# Patient Record
Sex: Female | Born: 2000 | Race: White | Hispanic: No | Marital: Single | Smoking: Never smoker
Health system: Southern US, Community
[De-identification: ages and names within clinical notes are randomized; demographics above are authoritative.]

## PROBLEM LIST (undated history)

## (undated) DIAGNOSIS — G43109 Migraine with aura, not intractable, without status migrainosus: Secondary | ICD-10-CM

## (undated) HISTORY — DX: Migraine with aura, not intractable, without status migrainosus: G43.109

---

## 2021-07-17 ENCOUNTER — Emergency Department
Admission: EM | Admit: 2021-07-17 | Discharge: 2021-07-18 | Disposition: A | Discharge status: Home / Self Care | Payer: BLUE CROSS/BLUE SHIELD | Attending: Emergency Medicine | Admitting: Emergency Medicine

## 2021-07-17 ENCOUNTER — Other Ambulatory Visit: Payer: Self-pay

## 2021-07-17 DIAGNOSIS — R7401 Elevation of levels of liver transaminase levels: Secondary | ICD-10-CM | POA: Insufficient documentation

## 2021-07-17 DIAGNOSIS — Z20822 Contact with and (suspected) exposure to covid-19: Secondary | ICD-10-CM | POA: Insufficient documentation

## 2021-07-17 DIAGNOSIS — J029 Acute pharyngitis, unspecified: Secondary | ICD-10-CM | POA: Insufficient documentation

## 2021-07-17 DIAGNOSIS — B2790 Infectious mononucleosis, unspecified without complication: Secondary | ICD-10-CM | POA: Diagnosis not present

## 2021-07-17 DIAGNOSIS — D751 Secondary polycythemia: Secondary | ICD-10-CM | POA: Insufficient documentation

## 2021-07-17 DIAGNOSIS — R7981 Abnormal blood-gas level: Secondary | ICD-10-CM | POA: Diagnosis not present

## 2021-07-17 DIAGNOSIS — R7989 Other specified abnormal findings of blood chemistry: Secondary | ICD-10-CM | POA: Insufficient documentation

## 2021-07-17 DIAGNOSIS — R509 Fever, unspecified: Secondary | ICD-10-CM | POA: Diagnosis present

## 2021-07-17 DIAGNOSIS — R8289 Other abnormal findings on cytological and histological examination of urine: Secondary | ICD-10-CM | POA: Diagnosis not present

## 2021-07-17 LAB — CBC WITH DIFFERENTIAL/PLATELET
Abs Immature Granulocytes: 0.05 10*3/uL (ref 0.00–0.07)
Basophils Absolute: 0.1 10*3/uL (ref 0.0–0.1)
Basophils Relative: 0 %
Eosinophils Absolute: 0 10*3/uL (ref 0.0–0.5)
Eosinophils Relative: 0 %
HCT: 48.3 % — ABNORMAL HIGH (ref 36.0–46.0)
Hemoglobin: 15.4 g/dL — ABNORMAL HIGH (ref 12.0–15.0)
Immature Granulocytes: 0 %
Lymphocytes Relative: 68 %
Lymphs Abs: 7.7 10*3/uL — ABNORMAL HIGH (ref 0.7–4.0)
MCH: 27.3 pg (ref 26.0–34.0)
MCHC: 31.9 g/dL (ref 30.0–36.0)
MCV: 85.6 fL (ref 80.0–100.0)
Monocytes Absolute: 0.6 10*3/uL (ref 0.1–1.0)
Monocytes Relative: 5 %
Neutro Abs: 3.2 10*3/uL (ref 1.7–7.7)
Neutrophils Relative %: 27 %
Platelets: 255 10*3/uL (ref 150–400)
RBC: 5.64 MIL/uL — ABNORMAL HIGH (ref 3.87–5.11)
RDW: 14.3 % (ref 11.5–15.5)
Smear Review: NORMAL
WBC Morphology: ABNORMAL
WBC: 11.5 10*3/uL — ABNORMAL HIGH (ref 4.0–10.5)
nRBC: 0 % (ref 0.0–0.2)

## 2021-07-17 LAB — BASIC METABOLIC PANEL
Anion gap: 13 (ref 5–15)
BUN: 9 mg/dL (ref 6–20)
CO2: 21 mmol/L — ABNORMAL LOW (ref 22–32)
Calcium: 9.2 mg/dL (ref 8.9–10.3)
Chloride: 103 mmol/L (ref 98–111)
Creatinine, Ser: 0.47 mg/dL (ref 0.44–1.00)
GFR, Estimated: >60 mL/min (ref >60)
Glucose, Bld: 88 mg/dL (ref 70–99)
Potassium: 3.8 mmol/L (ref 3.5–5.1)
Sodium: 137 mmol/L (ref 135–145)

## 2021-07-17 LAB — URINALYSIS, ROUTINE W REFLEX MICROSCOPIC
Glucose, UA: NEGATIVE mg/dL
Ketones, ur: 20 mg/dL — AB
Leukocytes,Ua: NEGATIVE
Nitrite: NEGATIVE
Protein, ur: 100 mg/dL — AB
Specific Gravity, Urine: 1.038 — ABNORMAL HIGH (ref 1.005–1.030)
Squamous Epithelial / HPF: >50 — ABNORMAL HIGH (ref 0–5)
pH: 5 (ref 5.0–8.0)

## 2021-07-17 LAB — RESP PANEL BY RT-PCR (FLU A&B, COVID) ARPGX2
Influenza A by PCR: NEGATIVE
Influenza B by PCR: NEGATIVE
SARS Coronavirus 2 by RT PCR: NEGATIVE

## 2021-07-17 LAB — HCG, QUANTITATIVE, PREGNANCY: hCG, Beta Chain, Quant, S: <1 m[IU]/mL (ref <5)

## 2021-07-17 MED ORDER — ONDANSETRON 4 MG PO TBDP
4.0000 mg | ORAL_TABLET | Freq: Once | ORAL | Status: AC
Start: 2021-07-17 — End: 2021-07-17
  Administered 2021-07-17: 4 mg via ORAL
  Filled 2021-07-17: qty 1

## 2021-07-17 MED ORDER — KETOROLAC TROMETHAMINE 30 MG/ML IJ SOLN
15.0000 mg | Freq: Once | INTRAMUSCULAR | Status: AC
  Administered 2021-07-17: 15 mg via INTRAMUSCULAR
  Filled 2021-07-17: qty 1

## 2021-07-17 NOTE — ED Provider Notes (Signed)
? ?[facility] ?Provider Note ? ? ? Event Date/Time  ? First MD Initiated Contact with Patient 07/17/21 2251   ?  (approximate) ? ? ?History  ? ?Emesis ? ? ?HPI ? ?Sharon Mata is a 21 y.o. female  with no pmh who presents with emesis, fever body aches.  Symptoms have been going on for about 6 days.  Patient endorses diffuse body aches as well as intermittent fever as high as 100.  She has had multiple episodes of emesis about 2 to 3/day and decreased p.o. intake.  Denies diarrhea or abdominal pain.  She was at the beach over the weekend and had 2 syncopal episodes after standing up.  She has mild headache no visual change numbness weakness.  Denies tick bites or sick contacts.  Does have mild sore throat as well as some neck tenderness.  Today she developed urticaria on her bilateral upper extremities which has resolved.  Did see urgent care earlier in the week was told this was likely viral and had negative Monospot.  Denies urinary symptoms chest pain shortness of breath does have mild runny nose. ?  ? ?History reviewed. No pertinent past medical history. ? ?There are no problems to display for this patient. ? ? ? ?Physical Exam  ?Triage Vital Signs: ?ED Triage Vitals  ?Enc Vitals Group  ?   BP 07/17/21 1534 110/71  ?   Pulse Rate 07/17/21 1534 99  ?   Resp 07/17/21 1534 16  ?   Temp 07/17/21 1534 99.5 ?F (37.5 ?C)  ?   Temp Source 07/17/21 1534 Oral  ?   SpO2 07/17/21 1534 98 %  ?   Weight 07/17/21 1605 120 lb (54.4 kg)  ?   Height 07/17/21 1605 '5\' 5"'  (1.651 m)  ?   Head Circumference --   ?   Peak Flow --   ?   Pain Score 07/17/21 1613 0  ?   Pain Loc --   ?   Pain Edu? --   ?   Excl. in [place]? --   ? ? ?Most recent vital signs: ?Vitals:  ? 07/17/21 2309 07/18/21 0204  ?BP: 113/75 99/61  ?Pulse: 91 74  ?Resp: 16 16  ?Temp:    ?SpO2: 99% 99%  ? ? ? ?General: Awake, no distress.  ?CV:  Good peripheral perfusion.  ?Resp:  Normal effort.  Lungs are clear ?Abd:  No distention.  Abdomen is soft  nontender, no splenomegaly ?Neuro:             Awake, Alert, Oriented x 3  ?Other:  Left posterior cervical lymphadenopathy, mildly tender ?Bilateral tonsillar exudate ?No rashes ? ? ?ED Results / Procedures / Treatments  ?Labs ?(all labs ordered are listed, but only abnormal results are displayed) ?Labs Reviewed  ?CBC WITH DIFFERENTIAL/PLATELET - Abnormal; Notable for the following components:  ?    Result Value  ? WBC 11.5 (*)   ? RBC 5.64 (*)   ? Hemoglobin 15.4 (*)   ? HCT 48.3 (*)   ? Lymphs Abs 7.7 (*)   ? All other components within normal limits  ?BASIC METABOLIC PANEL - Abnormal; Notable for the following components:  ? CO2 21 (*)   ? All other components within normal limits  ?URINALYSIS, ROUTINE W REFLEX MICROSCOPIC - Abnormal; Notable for the following components:  ? Color, Urine AMBER (*)   ? APPearance CLOUDY (*)   ? Specific Gravity, Urine 1.038 (*)   ? Hgb urine  dipstick MODERATE (*)   ? Bilirubin Urine MODERATE (*)   ? Ketones, ur 20 (*)   ? Protein, ur 100 (*)   ? Bacteria, UA FEW (*)   ? Squamous Epithelial / LPF >50 (*)   ? All other components within normal limits  ?HEPATIC FUNCTION PANEL - Abnormal; Notable for the following components:  ? AST 165 (*)   ? ALT 231 (*)   ? Alkaline Phosphatase 271 (*)   ? Total Bilirubin 4.0 (*)   ? Bilirubin, Direct 2.3 (*)   ? Indirect Bilirubin 1.7 (*)   ? All other components within normal limits  ?ACETAMINOPHEN LEVEL - Abnormal; Notable for the following components:  ? Acetaminophen (Tylenol), Serum <10 (*)   ? All other components within normal limits  ?MONONUCLEOSIS SCREEN - Abnormal; Notable for the following components:  ? Mono Screen POSITIVE (*)   ? All other components within normal limits  ?RESP PANEL BY RT-PCR (FLU A&B, COVID) ARPGX2  ?GROUP A STREP BY PCR  ?HCG, QUANTITATIVE, PREGNANCY  ?PATHOLOGIST SMEAR REVIEW  ?HEPATITIS PANEL, ACUTE  ? ? ? ?EKG ? ? ? ? ?RADIOLOGY ? ? ? ?PROCEDURES: ? ?Critical Care performed: No ? ?Procedures ? ?The patient is  on the cardiac monitor to evaluate for evidence of arrhythmia and/or significant heart rate changes. ? ? ?MEDICATIONS ORDERED IN ED: ?Medications  ?ondansetron (ZOFRAN-ODT) disintegrating tablet 4 mg (4 mg Oral Given 07/17/21 2359)  ?ketorolac (TORADOL) 30 MG/ML injection 15 mg (15 mg Intramuscular Given 07/17/21 2359)  ?sodium chloride 0.9 % bolus 1,000 mL (1,000 mLs Intravenous New Bag/Given 07/18/21 0110)  ? ? ? ?IMPRESSION / MDM / ASSESSMENT AND PLAN / ED COURSE  ?I reviewed the triage vital signs and the nursing notes. ?             ?               ? ?Differential diagnosis includes, but is not limited to, viral illness, mono, strep, UTI, pneumonia, less likely tickborne illness ? ?Patient is a 21 year old female presents with several nonspecific symptoms for about 6 days including fever body aches headache nausea vomiting sore throat.  Vitals are reassuring.  On exam she looks well and nontoxic.  Her physical exam is notable for left posterior cervical lymphadenopathy tonsillar exudate but otherwise she has moist mucous membranes benign abdomen no rashes no meningismus.  Her labs show a increase in absolute lymphocytes and some polycythemia likely in the setting of dehydration.  Bicarb mildly low at 21.  Pregnancy test is negative.  UA with significant number of squames 21-50 WBCs but patient without urinary symptoms suspect that this is contaminated sample and does not represent UTI.  Overall I suspect mono versus viral illness.  We will send strep Monospot and give Zofran Toradol and see if she can tolerate p.o.  We will add on hepatic function panel as well.  If not tolerating p.o. will need IV fluids and IV antiemetics. ? ?Patient does not want to be stuck again for repeat Monospot, apparently this was negative on Monday.  I had ordered it because can be negative early on in illness but think it is reasonable to hold off.  Zofran patient tolerating p.o. feels improved. ? ?Patient's LFTs are elevated bili is 4  AST ALT mildly elevated as is alk phos. we will send hepatitis panel.  Patient agreeable to getting Monospot now as I think this would be important to know.  We will also get  a right upper quadrant ultrasound.  We will send Tylenol level. ? ?Patient's right upper quadrant ultrasound is normal.  Monospot notably positive which I think explains her whole constellation of symptoms.  Hepatitis panel still pending Tylenol level is negative.  Patient tolerating p.o. she did get a liter of fluid in the ED.  Overall she feels like she will be able to manage her symptoms and would like to be discharged.  I recommended that she return for repeat hepatic function panel in about 1 week.  We discussed return to the ED more emergently if she develops any yellowing of the skin or eyes blood in her stool pale stool or dark urine.  Also recommending returning if unable to tolerate p.o. ?FINAL CLINICAL IMPRESSION(S) / ED DIAGNOSES  ? ?Final diagnoses:  ?Elevated liver function tests  ?Infectious mononucleosis without complication, infectious mononucleosis due to unspecified organism  ? ? ? ?Rx / DC Orders  ? ?ED Discharge Orders   ? ?      Ordered  ?  ondansetron (ZOFRAN) 4 MG tablet  Daily PRN       ? 07/18/21 0229  ? ?  ?  ? ?  ? ? ? ?Note:  This document was prepared using Dragon voice recognition software and may include unintentional dictation errors. ?  ?Rada Hay, MD ?07/18/21 1959 ? ?

## 2021-07-17 NOTE — ED Triage Notes (Signed)
Pt states she has had fever chills and emesis x one week. Pt states she passed out last Friday. Pt states she continues to feel worse and not any better.  ?

## 2021-07-17 NOTE — ED Notes (Addendum)
After taking pts vitals, pt asked how much longer.  Explained that there are 8 people ahead of her.  Could not give her an exact time.  Pt and visitor gathered all belongings and walked out of ED lobby.  Unsure if she will be returning.  ?

## 2021-07-17 NOTE — ED Provider Triage Note (Signed)
?  Emergency Medicine Provider Triage Evaluation Note ? ?Sharon Mata , a 21 y.o.female,  was evaluated in triage.  Pt complains of fever, body aches, hives, and multiple episodes of vomiting for the past few days.  She states that she was recently at the beach and feels she may have caught something. ? ? ?Review of Systems  ?Positive: Fever, body aches, hives, vomiting ?Negative: Denies fever, chest pain, abdominal pain. ? ?Physical Exam  ? ?Vitals:  ? 07/17/21 1534  ?BP: 110/71  ?Pulse: 99  ?Resp: 16  ?Temp: 99.5 ?F (37.5 ?C)  ?SpO2: 98%  ? ?Gen:   Awake, no distress   ?Resp:  Normal effort  ?MSK:   Moves extremities without difficulty  ?Other:   ? ?Medical Decision Making  ?Given the patient's initial medical screening exam, the following diagnostic evaluation has been ordered. The patient will be placed in the appropriate treatment space, once one is available, to complete the evaluation and treatment. I have discussed the plan of care with the patient and I have advised the patient that an ED physician or mid-level practitioner will reevaluate their condition after the test results have been received, as the results may give them additional insight into the type of treatment they may need.  ? ? ?Diagnostics: Labs, urinalysis. ? ?Treatments: Ondansetron. ?  ?Varney Daily, [place] ?07/17/21 2318 ? ?

## 2021-07-18 ENCOUNTER — Emergency Department: Payer: BLUE CROSS/BLUE SHIELD

## 2021-07-18 LAB — HEPATIC FUNCTION PANEL
ALT: 231 U/L — ABNORMAL HIGH (ref 0–44)
AST: 165 U/L — ABNORMAL HIGH (ref 15–41)
Albumin: 4.2 g/dL (ref 3.5–5.0)
Alkaline Phosphatase: 271 U/L — ABNORMAL HIGH (ref 38–126)
Bilirubin, Direct: 2.3 mg/dL — ABNORMAL HIGH (ref 0.0–0.2)
Indirect Bilirubin: 1.7 mg/dL — ABNORMAL HIGH (ref 0.3–0.9)
Total Bilirubin: 4 mg/dL — ABNORMAL HIGH (ref 0.3–1.2)
Total Protein: 8 g/dL (ref 6.5–8.1)

## 2021-07-18 LAB — HEPATITIS PANEL, ACUTE
HCV Ab: NONREACTIVE
Hep A IgM: NONREACTIVE
Hep B C IgM: NONREACTIVE
Hepatitis B Surface Ag: NONREACTIVE

## 2021-07-18 LAB — ACETAMINOPHEN LEVEL: Acetaminophen (Tylenol), Serum: <10 ug/mL — ABNORMAL LOW (ref 10–30)

## 2021-07-18 LAB — MONONUCLEOSIS SCREEN: Mono Screen: POSITIVE — AB

## 2021-07-18 LAB — PATHOLOGIST SMEAR REVIEW

## 2021-07-18 LAB — GROUP A STREP BY PCR: Group A Strep by PCR: NOT DETECTED

## 2021-07-18 MED ORDER — ONDANSETRON HCL 4 MG PO TABS: 4.0000 mg | ORAL_TABLET | Freq: Every day | ORAL | 0 refills | Status: AC | PRN

## 2021-07-18 MED ORDER — SODIUM CHLORIDE 0.9 % IV BOLUS
1000.0000 mL | Freq: Once | INTRAVENOUS | Status: AC
  Administered 2021-07-18: 1000 mL via INTRAVENOUS

## 2021-07-18 NOTE — ED Notes (Signed)
Patient taken to Ultrasound department at this time. ?

## 2021-07-18 NOTE — Discharge Instructions (Addendum)
You have mononucleosis.  Your liver function tests were somewhat elevated which is likely from the mono.  Please follow-up for repeat blood work in 1 week.  You can either follow-up at [facility] clinic or return to the emergency department.  Please return to the ED sooner if you are developing yellow skin or yellow eyes pale stool or unable to drink. ? ?Please avoid taking Tylenol.  You can take Motrin for body aches.  You can take the Zofran for nausea and vomiting. ?

## 2021-07-18 NOTE — ED Notes (Signed)
Patient declined mononucleosis screen. Dr. Sidney Ace aware.  ?

## 2021-07-18 NOTE — ED Notes (Addendum)
Pt given a cup of water 

## 2021-11-19 ENCOUNTER — Encounter: Payer: Self-pay | Admitting: Adult Health

## 2021-11-19 ENCOUNTER — Ambulatory Visit (INDEPENDENT_AMBULATORY_CARE_PROVIDER_SITE_OTHER): Payer: BLUE CROSS/BLUE SHIELD | Admitting: Adult Health

## 2021-11-19 VITALS — BP 110/70 | HR 116 | Temp 99.0°F | Ht 64.5 in | Wt 128.0 lb

## 2021-11-19 DIAGNOSIS — R300 Dysuria: Secondary | ICD-10-CM | POA: Diagnosis not present

## 2021-11-19 DIAGNOSIS — N3090 Cystitis, unspecified without hematuria: Secondary | ICD-10-CM | POA: Diagnosis not present

## 2021-11-19 LAB — POCT URINALYSIS DIPSTICK (MANUAL)
Nitrite, UA: POSITIVE — AB
Poct Bilirubin: NEGATIVE
Poct Glucose: NORMAL mg/dL
Poct Ketones: NEGATIVE
Poct Urobilinogen: NORMAL mg/dL
Spec Grav, UA: 1.01 (ref 1.010–1.025)
pH, UA: 5 (ref 5.0–8.0)

## 2021-11-19 MED ORDER — NITROFURANTOIN MONOHYD MACRO 100 MG PO CAPS: 100.0000 mg | ORAL_CAPSULE | Freq: Two times a day (BID) | ORAL | 0 refills | Status: DC

## 2021-11-19 NOTE — Progress Notes (Signed)
[facility] [address] [place], [place] [address] Phone: [phone] Fax: [phone]   Office Visit Note  Patient Name: Sharon Mata  Date of Birth:[birth year]  Med Rec number [id number]  Date of Service: 11/19/2021  Patient has no known allergies.  Chief Complaint  Patient presents with   Urinary Tract Infection    UTI off and on for 3 weeks, BA and headache. Has drank cranberry juice and helped some.     Urinary Tract Infection  Associated symptoms include flank pain, frequency and urgency. Pertinent negatives include no hematuria, nausea or vomiting.    Patient reports she has been having some abdominal pain intermittently for the last 3 weeks.  She believes it is a UTI, and has been taking some otc meds like cranberry with no help.  She is currently sexually active with one female partner.  They are using condoms.  She has never been tested for STI's.  She is currently having dysuria, low back pain, and leg pain at times.  Urinary frequency and hesitancy.   Current Medication:  Outpatient Encounter Medications as of 11/19/2021  Medication Sig   nitrofurantoin, macrocrystal-monohydrate, (MACROBID) 100 MG capsule Take 1 capsule (100 mg total) by mouth 2 (two) times daily.   No facility-administered encounter medications on file as of 11/19/2021.    Medical History: No past medical history on file.   Vital Signs: BP 110/70 (BP Location: Left Arm, Patient Position: Sitting, Cuff Size: Normal)   Pulse (!) 116   Temp 99 F (37.2 C) (Tympanic)   Ht 5' 4.5" (1.638 m)   Wt 128 lb (58.1 kg)   SpO2 98%   BMI 21.63 kg/m    Review of Systems  Constitutional:  Positive for fatigue. Negative for diaphoresis and fever.  HENT:  Negative for sore throat.   Respiratory:  Negative for cough.   Gastrointestinal:  Negative for abdominal pain, diarrhea, nausea and vomiting.  Genitourinary:  Positive for dysuria, flank pain, frequency and urgency. Negative for hematuria.   Musculoskeletal:  Positive for myalgias.  Neurological:  Negative for headaches.    Physical Exam Vitals and nursing note reviewed.  Constitutional:      Appearance: Normal appearance.  Abdominal:     Tenderness: There is no abdominal tenderness. There is no right CVA tenderness or left CVA tenderness.  Neurological:     Mental Status: She is alert.    Assessment/Plan: 1. Cystitis Take Nitrofurantoin every 12 hours (twice a day) with food x 7d; Finish all antibiotics. Drink plenty of water. Avoid or limit alcohol and caffeine, which may make symptoms worse. You may take an over-the-counter pain reliever (i.e., AZO urinary pain relief, Tylenol) as needed for pain next 1-2 days. Send secure message to provider or schedule return appointment as needed for new/worsening symptoms (such as fever or abdominal pain) if your symptoms are not improving after 2-3 days taking antibiotics or if your symptoms do not completely resolve following antibiotics.  - nitrofurantoin, macrocrystal-monohydrate, (MACROBID) 100 MG capsule; Take 1 capsule (100 mg total) by mouth 2 (two) times daily.  Dispense: 14 capsule; Refill: 0  2. Dysuria - POCT Urinalysis Dip Manual     General Counseling: Cara verbalizes understanding of the findings of todays visit and agrees with plan of treatment. I have discussed any further diagnostic evaluation that may be needed or ordered today. We also reviewed her medications today. she has been encouraged to call the office with any questions or concerns that should arise related  to todays visit.   Orders Placed This Encounter  Procedures   POCT Urinalysis Dip Manual    Meds ordered this encounter  Medications   nitrofurantoin, macrocrystal-monohydrate, (MACROBID) 100 MG capsule    Sig: Take 1 capsule (100 mg total) by mouth 2 (two) times daily.    Dispense:  14 capsule    Refill:  0    Time spent:20 Minutes    Johnna Acosta AGNP-C Nurse Practitioner

## 2022-04-06 ENCOUNTER — Ambulatory Visit: Payer: BLUE CROSS/BLUE SHIELD | Admitting: Adult Health

## 2022-05-02 ENCOUNTER — Encounter: Payer: Self-pay | Admitting: Family

## 2022-05-02 ENCOUNTER — Ambulatory Visit (INDEPENDENT_AMBULATORY_CARE_PROVIDER_SITE_OTHER): Payer: BLUE CROSS/BLUE SHIELD | Admitting: Family

## 2022-05-02 ENCOUNTER — Other Ambulatory Visit: Payer: Self-pay

## 2022-05-02 VITALS — BP 102/68 | HR 108 | Temp 98.9°F | Ht 65.16 in | Wt 131.0 lb

## 2022-05-02 DIAGNOSIS — R52 Pain, unspecified: Secondary | ICD-10-CM

## 2022-05-02 DIAGNOSIS — J111 Influenza due to unidentified influenza virus with other respiratory manifestations: Secondary | ICD-10-CM

## 2022-05-02 LAB — POC SOFIA 2 FLU + SARS ANTIGEN FIA
Influenza A, POC: NEGATIVE
Influenza B, POC: NEGATIVE
SARS Coronavirus 2 Ag: NEGATIVE

## 2022-05-02 NOTE — Progress Notes (Signed)
[place] [place] [place], [place] [address] Phone: [phone] Fax: [phone]   Office Visit Note  Patient Name: Sharon Mata  Date of [id number]  Med Rec number [id number]  Date of Service: 05/02/2022  Patient has no known allergies.  Chief Complaint  Patient presents with   Generalized Body Aches   Cough   Sore Throat   Migraine    24 hrs   Nasal Congestion   Fatigue     Cough Associated symptoms include chills, a fever and a sore throat. Pertinent negatives include no shortness of breath.  Sore Throat  Associated symptoms include congestion and coughing. Pertinent negatives include no shortness of breath.  Migraine  Associated symptoms include coughing, a fever and a sore throat.   Pt presents with cold/flu symptoms that started 2 days ago.  Otc Dayquil, Advil and Tylenol.  Headache 8 of 10.      Current Medication:  Outpatient Encounter Medications as of 05/02/2022  Medication Sig   [DISCONTINUED] nitrofurantoin, macrocrystal-monohydrate, (MACROBID) 100 MG capsule Take 1 capsule (100 mg total) by mouth 2 (two) times daily.   No facility-administered encounter medications on file as of 05/02/2022.      Medical History: No past medical history on file.   Vital Signs: Vitals:   05/02/22 1049  BP: 102/68  Pulse: (!) 108  Temp: 98.9 F (37.2 C)  SpO2: 98%      Review of Systems  Constitutional:  Positive for activity change, chills, fatigue and fever.  HENT:  Positive for congestion and sore throat.   Respiratory:  Positive for cough. Negative for shortness of breath.   Gastrointestinal: Negative.   Skin: Negative.     Physical Exam Constitutional:      Appearance: Normal appearance. She is well-developed.  HENT:     Right Ear: Tympanic membrane normal.     Left Ear: Tympanic membrane normal.     Nose: Congestion present.     Mouth/Throat:     Mouth: Mucous membranes are moist.     Pharynx: Oropharynx is clear.   Cardiovascular:     Rate and Rhythm: Normal rate and regular rhythm.  Pulmonary:     Effort: Pulmonary effort is normal.     Breath sounds: Normal breath sounds.  Musculoskeletal:     Cervical back: Normal range of motion and neck supple.  Skin:    General: Skin is warm.  Neurological:     General: No focal deficit present.     Mental Status: She is alert and oriented to person, place, and time.       Assessment/Plan: 1. Body aches  - POC SOFIA 2 FLU + SARS ANTIGEN FIA Results for orders placed or performed in visit on 05/02/22 (from the past 24 hour(s))  POC SOFIA 2 FLU + SARS ANTIGEN FIA     Status: Normal   Collection Time: 05/02/22 11:08 AM  Result Value Ref Range   Influenza A, POC Negative Negative   Influenza B, POC Negative Negative   SARS Coronavirus 2 Ag Negative Negative    2. Influenza-like illness Reviewed viral findings.  Discussed increase regimen with otc Ibuprofen and Tylenol.  887m Ibuprofen alternating with 10085mof Tylenol every 4 hours.  Pt will call tomorrow if no improvement with migraine, will consider Toradol. Discussed rest and hydration.       General Counseling: Avmakiaya oflahertynderstanding of the findings of todays visit and agrees with plan of treatment. I have discussed any further  diagnostic evaluation that may be needed or ordered today. We also reviewed her medications today. she has been encouraged to call the office with any questions or concerns that should arise related to todays visit.   Orders Placed This Encounter  Procedures   POC SOFIA 2 FLU + SARS ANTIGEN FIA    No orders of the defined types were placed in this encounter.   Time spent:15  Minutes Time spent includes review of chart, medications, test results, and follow up plan with the patient.    Wardell Honour, FNP-C Nurse Practitioner

## 2022-05-28 ENCOUNTER — Encounter: Payer: Self-pay | Admitting: Family Medicine

## 2022-05-28 ENCOUNTER — Ambulatory Visit (INDEPENDENT_AMBULATORY_CARE_PROVIDER_SITE_OTHER): Payer: BLUE CROSS/BLUE SHIELD | Admitting: Family Medicine

## 2022-05-28 VITALS — BP 112/68 | HR 107 | Temp 99.5°F | Wt 136.0 lb

## 2022-05-28 DIAGNOSIS — R350 Frequency of micturition: Secondary | ICD-10-CM

## 2022-05-28 DIAGNOSIS — R300 Dysuria: Secondary | ICD-10-CM

## 2022-05-28 LAB — POCT URINALYSIS DIPSTICK (MANUAL)
Leukocytes, UA: NEGATIVE
Nitrite, UA: NEGATIVE
Poct Bilirubin: NEGATIVE
Poct Blood: 50 — AB
Poct Glucose: NORMAL mg/dL
Poct Ketones: NEGATIVE
Poct Urobilinogen: NORMAL mg/dL
pH, UA: 6 (ref 5.0–8.0)

## 2022-05-28 MED ORDER — NITROFURANTOIN MONOHYD MACRO 100 MG PO CAPS: 100.0000 mg | ORAL_CAPSULE | Freq: Two times a day (BID) | ORAL | 0 refills | Status: DC

## 2022-05-28 NOTE — Progress Notes (Signed)
[place] [place], [place] [address] Phone: [phone] Fax: [phone]   Office Visit Note  Patient Name: Sharon Mata  Date of [id number]  Med Rec number [id number]  Date of Service: 05/28/2022  Patient has no known allergies.  Chief Complaint  Patient presents with   Urinary Tract Infection     Burning with urination, frequnet urinatin, getting worse Started 2 week ago - has taken cranberry juice and stayed hydrated  Slight back pain , no fever  Urinary Tract Infection  Associated symptoms include frequency and urgency.      Current Medication:  Outpatient Encounter Medications as of 05/28/2022  Medication Sig   acetaminophen (TYLENOL) 325 MG tablet Take 650 mg by mouth every 6 (six) hours as needed. (Patient not taking: Reported on 05/28/2022)   No facility-administered encounter medications on file as of 05/28/2022.      Medical History: No past medical history on file.   Vital Signs: BP 112/68   Pulse (!) 107   Temp 99.5 F (37.5 C) (Tympanic)   Wt 136 lb (61.7 kg)   SpO2 98%   BMI 22.52 kg/m    Review of Systems  Constitutional:  Negative for fever.  Genitourinary:  Positive for decreased urine volume, dysuria, frequency and urgency.    Physical Exam Vitals reviewed.  Constitutional:      Appearance: Normal appearance.  Abdominal:     Tenderness: There is no abdominal tenderness. There is no right CVA tenderness or left CVA tenderness.  Neurological:     Mental Status: She is alert.      Assessment/Plan:  1. Frequent urination - POCT Urinalysis Dip Manual  Results for orders placed or performed in visit on 05/28/22 (from the past 24 hour(s))  POCT Urinalysis Dip Manual     Status: Abnormal   Collection Time: 05/28/22  3:49 PM  Result Value Ref Range   Spec Grav, UA     pH, UA 6.0 5.0 - 8.0   Leukocytes, UA Negative Negative   Nitrite, UA Negative Negative   Poct Protein +++500 (A) Negative,  trace mg/dL   Poct Glucose Normal Normal mg/dL   Poct Ketones Negative Negative   Poct Urobilinogen Normal Normal mg/dL   Poct Bilirubin Negative Negative   Poct Blood =50 (A) Negative, trace     2. Dysuria Take macrobid two times daily for 7 days Start 4 ounces of cranberry juice at bedtime to try to prveent further infections      General Counseling: Marene Lenz understanding of the findings of todays visit and agrees with plan of treatment. I have discussed any further diagnostic evaluation that may be needed or ordered today. We also reviewed her medications today. she has been encouraged to call the office with any questions or concerns that should arise related to todays visit.   No orders of the defined types were placed in this encounter.   No orders of the defined types were placed in this encounter.    Dr Evette Doffing Sunny Aguon ABFM University Physician

## 2022-07-06 ENCOUNTER — Ambulatory Visit (INDEPENDENT_AMBULATORY_CARE_PROVIDER_SITE_OTHER): Payer: BLUE CROSS/BLUE SHIELD | Admitting: Adult Health

## 2022-07-06 ENCOUNTER — Encounter: Payer: Self-pay | Admitting: Adult Health

## 2022-07-06 VITALS — BP 114/62 | HR 105 | Temp 99.0°F

## 2022-07-06 DIAGNOSIS — R0982 Postnasal drip: Secondary | ICD-10-CM | POA: Diagnosis not present

## 2022-07-06 DIAGNOSIS — J301 Allergic rhinitis due to pollen: Secondary | ICD-10-CM | POA: Diagnosis not present

## 2022-07-06 NOTE — Progress Notes (Signed)
[facility] [address] [place], [place] [address] Phone: [phone] Fax: [phone]   Office Visit Note  Patient Name: Sharon Mata  Date of Birth:12-29-2000  Med Rec number [id number]  Date of Service: 07/06/2022  Patient has no known allergies.  Chief Complaint  Patient presents with   Acute Visit     HPI  Patient reports she has been sick for the past week.  She describes throat pain that has been keeping her awake at night. She also has congestion, ear pain, mild PND. She has been using the Netty pot with some improvement.  She had fever initially, but that quickly resolved. Advil helps at times.  It worse at night, and she feels pretty good during the day.  She is also taking an allergy medication daily as well.   Current Medication:  Outpatient Encounter Medications as of 07/06/2022  Medication Sig   acetaminophen (TYLENOL) 325 MG tablet Take 650 mg by mouth every 6 (six) hours as needed. (Patient not taking: Reported on 05/28/2022)   nitrofurantoin, macrocrystal-monohydrate, (MACROBID) 100 MG capsule Take 1 capsule (100 mg total) by mouth 2 (two) times daily. (Patient not taking: Reported on 07/06/2022)   No facility-administered encounter medications on file as of 07/06/2022.      Medical History: No past medical history on file.   Vital Signs: BP 114/62   Pulse (!) 105   Temp 99 F (37.2 C) (Tympanic)   SpO2 100%    Review of Systems  Constitutional:  Positive for fatigue. Negative for chills and fever.  HENT:  Positive for congestion, ear pain, postnasal drip, sinus pressure and sore throat.   Eyes:  Negative for pain and itching.  Respiratory:  Negative for cough.   Cardiovascular:  Negative for chest pain.  Gastrointestinal:  Negative for diarrhea, nausea and vomiting.    Physical Exam Vitals and nursing note reviewed.  Constitutional:      Appearance: Normal appearance.  HENT:     Head: Normocephalic.     Left Ear: Tympanic  membrane and ear canal normal.     Ears:     Comments: Right TM appears to have fluid, and some mild bulging    Nose: Nose normal.     Right Turbinates: Pale.     Left Turbinates: Pale.     Mouth/Throat:     Mouth: Mucous membranes are moist.  Eyes:     Pupils: Pupils are equal, round, and reactive to light.  Pulmonary:     Effort: Pulmonary effort is normal.     Breath sounds: Normal breath sounds.  Lymphadenopathy:     Cervical: No cervical adenopathy.  Neurological:     Mental Status: She is alert.    Assessment/Plan: 1. Seasonal allergic rhinitis due to pollen Discussed using Daily allergy medication such as Zyrtec or Claritin. Also instructed patient to use Flonase, Two sprays in each nostril twice daily. Follow up via MyChart messenger if symptoms fail to improve or may return to clinic as needed for worsening symptoms.    2. PND (post-nasal drip) Continue OTC meds as before.  Add Flonase as discussed.      General Counseling: adaline trejos understanding of the findings of todays visit and agrees with plan of treatment. I have discussed any further diagnostic evaluation that may be needed or ordered today. We also reviewed her medications today. she has been encouraged to call the office with any questions or concerns that should arise related to todays visit.  No orders of the defined types were placed in this encounter.   No orders of the defined types were placed in this encounter.   Time spent:15 Minutes Time spent includes review of chart, medications, test results, and follow up plan with the patient.    Johnna Acosta AGNP-C Nurse Practitioner

## 2022-07-08 ENCOUNTER — Encounter: Payer: Self-pay | Admitting: Adult Health

## 2023-12-17 ENCOUNTER — Encounter: Payer: Self-pay | Admitting: Medical

## 2023-12-17 ENCOUNTER — Ambulatory Visit (INDEPENDENT_AMBULATORY_CARE_PROVIDER_SITE_OTHER): Admitting: Medical

## 2023-12-17 ENCOUNTER — Other Ambulatory Visit: Payer: Self-pay

## 2023-12-17 VITALS — BP 112/60 | HR 88 | Temp 96.7°F | Wt 124.0 lb

## 2023-12-17 DIAGNOSIS — R300 Dysuria: Secondary | ICD-10-CM | POA: Diagnosis not present

## 2023-12-17 DIAGNOSIS — Z113 Encounter for screening for infections with a predominantly sexual mode of transmission: Secondary | ICD-10-CM

## 2023-12-17 DIAGNOSIS — Z3009 Encounter for other general counseling and advice on contraception: Secondary | ICD-10-CM

## 2023-12-17 LAB — POCT URINALYSIS DIPSTICK (MANUAL)
Leukocytes, UA: NEGATIVE
Nitrite, UA: NEGATIVE
Poct Bilirubin: NEGATIVE
Poct Glucose: NORMAL mg/dL
Poct Ketones: NEGATIVE
Poct Urobilinogen: NORMAL mg/dL
Spec Grav, UA: 1.02
pH, UA: 6

## 2023-12-17 NOTE — Patient Instructions (Addendum)
-  Drink plenty of water. Limit alcohol and caffeine. -You will receive a MyChart message notifying you of your lab results when they are available.  -Send message to provider in meantime as needed for new/worsening symptoms (such as fever, abdominal pain, frequent urination or increased pain with urination).  -Check out the [facility] website or [email or web address] for more information on birth control options. -Send me a message or schedule a follow up visit if you wish to discuss birth control in more detail. -Use condoms every time you have sex to help prevent sexually transmitted infections. -If you have unprotected sex, you may use Plan B (emergency contraception) within 72 hours. -Also, consider scheduling an appointment with us  or with your primary care/gynecologist for a pap smear/cervical cancer screening test. It is recommended to begin having this screening at age 45.

## 2023-12-17 NOTE — Progress Notes (Signed)
 [facility] [address] [place], [place] [address] Phone: [phone] Fax: [phone]   Office Visit Note  Patient Name: Sharon Mata  Date of Birth:2001-03-04  Med Rec number [id number]  Date of Service: 12/17/2023  Allergies: Patient has no known allergies.  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  Has had slight burning with urination last few days, not getting worse. No frequency or urgency. No abdominal pain, back pain, nausea/vomiting, fever or chills. No vaginal sx. No hematuria, urine order or cloudiness.  Sexually active with males, new partner as of few weeks ago. Using condoms most of time. Not on any birth control, has not taken in past.   LMP 12/04/23   Current Medication:  Outpatient Encounter Medications as of 12/17/2023  Medication Sig   escitalopram (LEXAPRO) 10 MG tablet Take 10 mg by mouth daily.   escitalopram (LEXAPRO) 5 MG tablet Take 5 mg by mouth daily.   spironolactone (ALDACTONE) 50 MG tablet    [DISCONTINUED] acetaminophen  (TYLENOL ) 325 MG tablet Take 650 mg by mouth every 6 (six) hours as needed. (Patient not taking: Reported on 12/17/2023)   [DISCONTINUED] nitrofurantoin , macrocrystal-monohydrate, (MACROBID ) 100 MG capsule Take 1 capsule (100 mg total) by mouth 2 (two) times daily. (Patient not taking: Reported on 12/17/2023)   No facility-administered encounter medications on file as of 12/17/2023.      Medical History: Past Medical History:  Diagnosis Date   Migraine with aura      Vital Signs: BP 112/60   Pulse 88   Temp (!) 96.7 F (35.9 C) (Tympanic)   Wt 124 lb (56.2 kg)   SpO2 98%   BMI 20.54 kg/m    Review of Systems  Constitutional:  Negative for chills and fever.  Genitourinary:  Positive for dysuria. Negative for flank pain, frequency, genital sores, hematuria, pelvic pain, urgency, vaginal discharge and vaginal pain.     Physical Exam Vitals reviewed.  Constitutional:      General: She is not in acute distress.    Appearance: She is not ill-appearing.  Neurological:     Mental Status: She is alert.    Results for orders placed or performed in visit on 12/17/23 (from the past 24 hours)  POCT Urinalysis Dip Manual     Status: None   Collection Time: 12/17/23  1:43 PM  Result Value Ref Range   Spec Grav, UA 1.020 1.010 - 1.025   pH, UA 6.0 5.0 - 8.0   Leukocytes, UA Negative Negative   Nitrite, UA Negative Negative   Poct Protein trace Negative, trace mg/dL   Poct Glucose Normal Normal mg/dL   Poct Ketones Negative Negative   Poct Urobilinogen Normal Normal mg/dL   Poct Bilirubin Negative Negative   Poct Blood trace Negative, trace     Assessment/Plan: 1. Dysuria (Primary) Minimal findings on urinalysis. Will send urine for culture. Encouraged to hydrate. Will follow up with results when available.   - POCT Urinalysis Dip Manual - Urine Culture  2. Screening examination for STD (sexually transmitted disease) Will send vaginal swab (self-collected) for STI screening. Encouraged consistent use of condoms.  - Chlamydia/Gonococcus/Trichomonas, NAA  3. Encounter for counseling regarding contraception Encouraged to consider starting another form of contraception given that condoms can be unreliable, especially when not used consistently. Discussed [facility] and [facility] as good options for information on different forms of birth control including possible  side effects. Encouraged patient to send message or schedule follow up visit with any questions or if she wishes to discuss options in more detail.    Patient Instructions  -Drink plenty of water. Limit alcohol and caffeine. -You will receive a MyChart message notifying you of your lab results when they are available.  -Send message to provider in meantime as needed for new/worsening symptoms (such as fever, abdominal pain,  frequent urination or increased pain with urination).  -Check out the [facility] website or [facility] for more information on birth control options. -Send me a message or schedule a follow up visit if you wish to discuss birth control in more detail. -Use condoms every time you have sex to help prevent sexually transmitted infections. -If you have unprotected sex, you may use Plan B (emergency contraception) within 72 hours. -Also, consider scheduling an appointment with us  or with your primary care/gynecologist for a pap smear/cervical cancer screening test. It is recommended to begin having this screening at age 52.     General Counseling: Deniya has been encouraged to call the office with any questions or concerns that should arise related to todays visit.    Time spent:20 Minutes    Joen Arts PA-C [facility] 12/17/2023 1:41 PM

## 2023-12-20 ENCOUNTER — Ambulatory Visit: Payer: Self-pay | Admitting: Medical

## 2023-12-20 LAB — URINE CULTURE

## 2023-12-21 LAB — CHLAMYDIA/GONOCOCCUS/TRICHOMONAS, NAA
Chlamydia by NAA: NEGATIVE
Gonococcus by NAA: NEGATIVE
Trich vag by NAA: NEGATIVE

## 2024-02-26 IMAGING — US US ABDOMEN LIMITED
1 series · 14 of 25 positions shown · non-contrast
Comparison: None Available.

CLINICAL DATA: Right upper quadrant abdominal pain.

EXAM:
ULTRASOUND ABDOMEN LIMITED RIGHT UPPER QUADRANT

[Series 1: us abdomen limited ruq (liver/gb) · 14 of 35 slices shown]
[im 1/35]
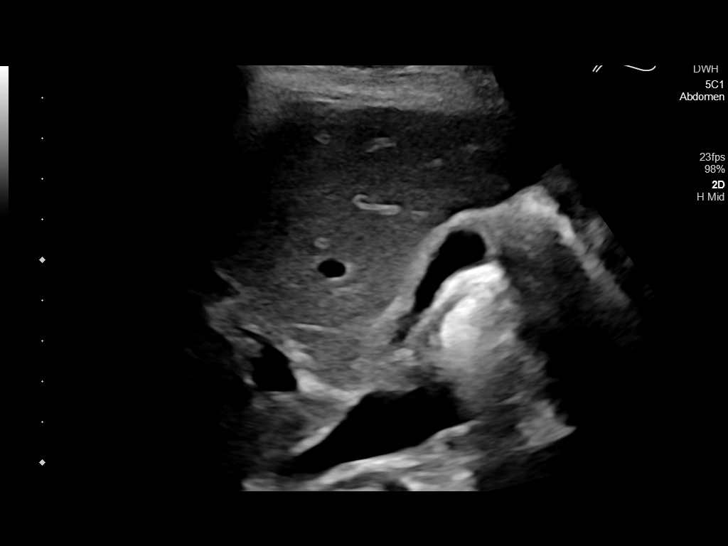
[im 3/35]
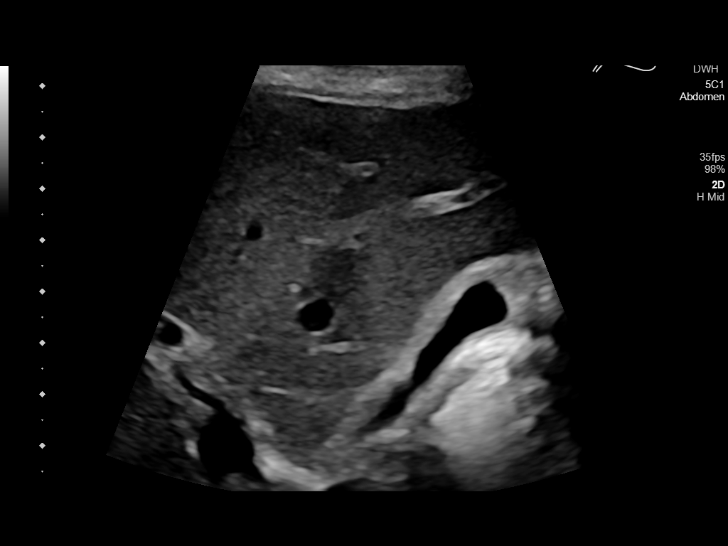
[im 6/35]
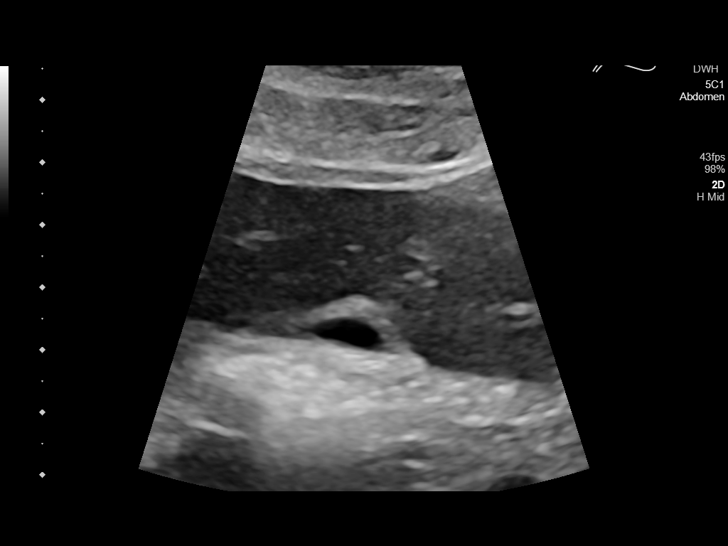
[im 9/35]
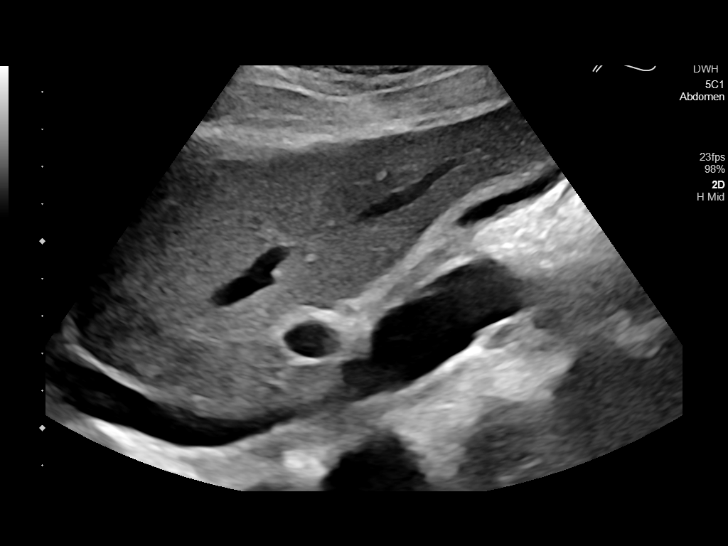
[im 12/35]
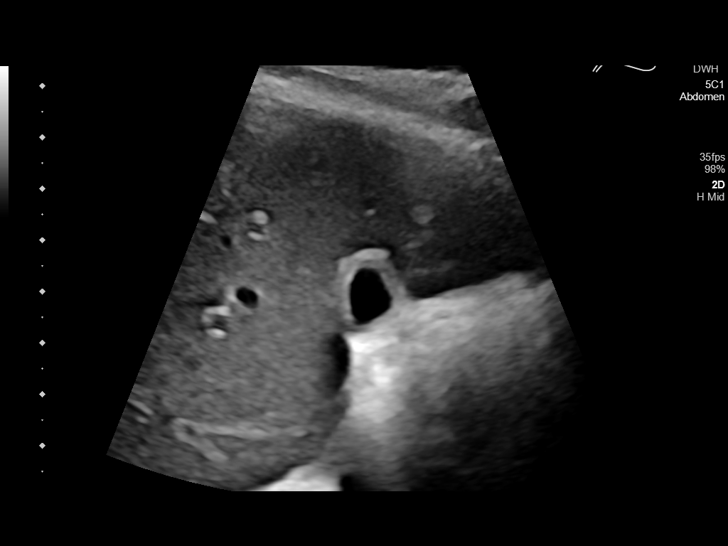
[im 13/35]
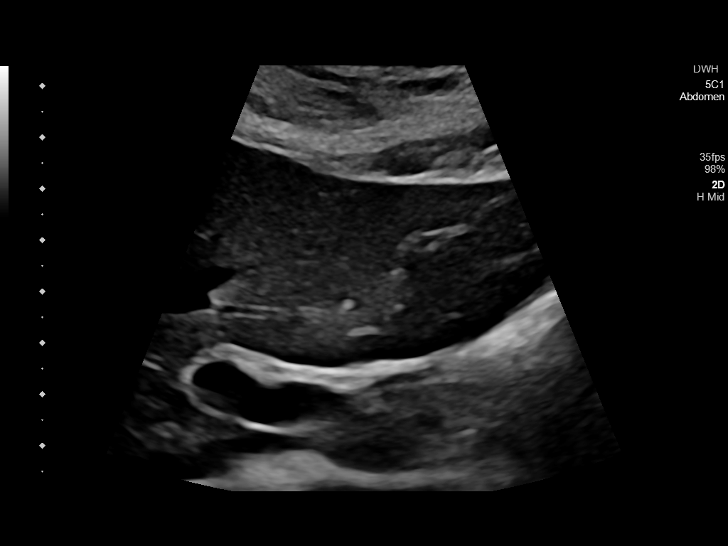
[im 16/35]
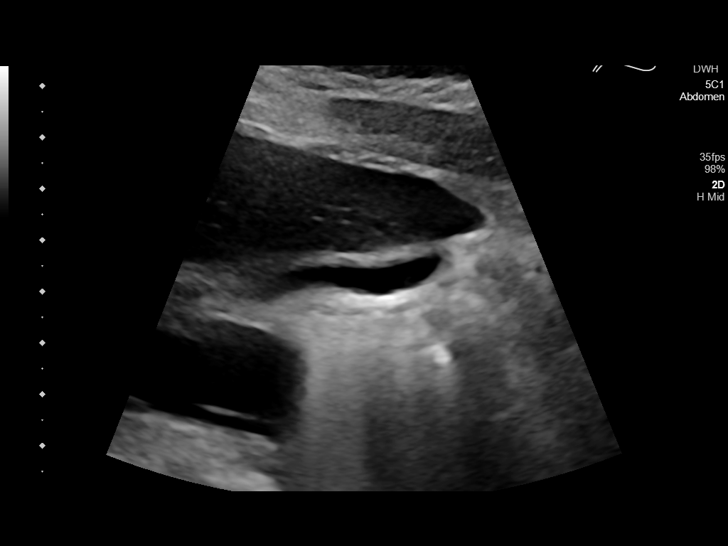
[im 19/35]
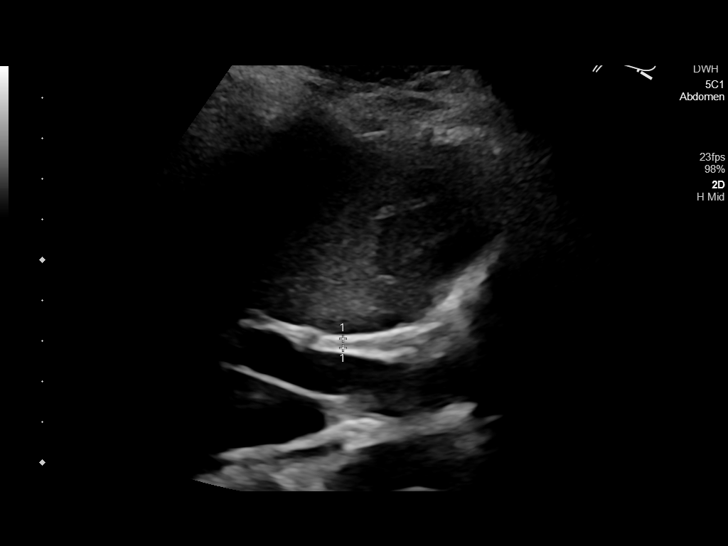
[im 22/35]
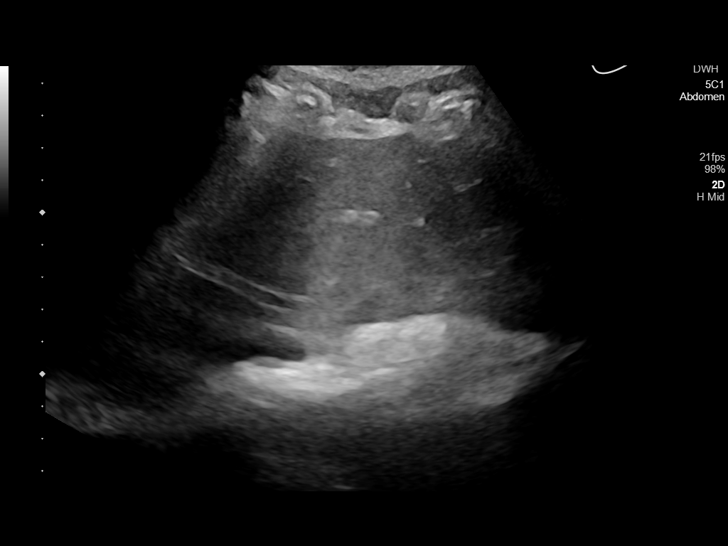
[im 23/35]
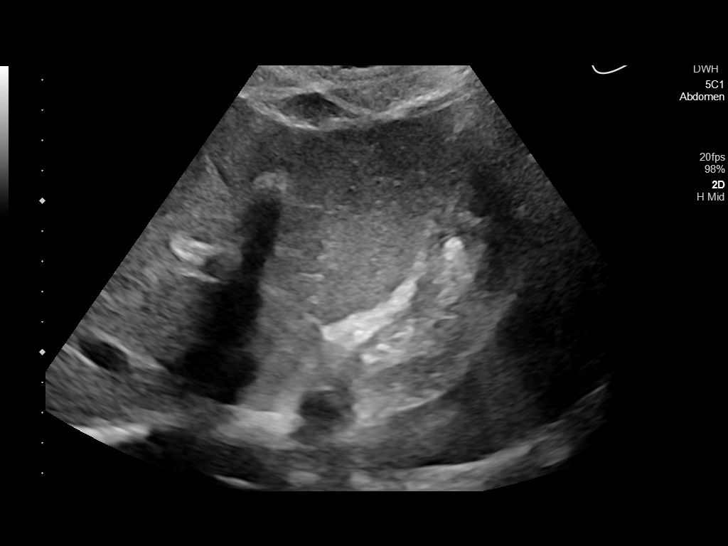
[im 26/35]
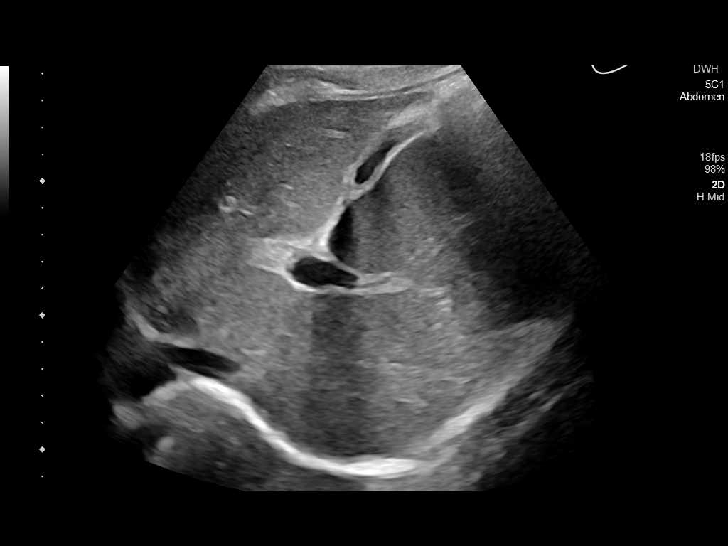
[im 29/35]
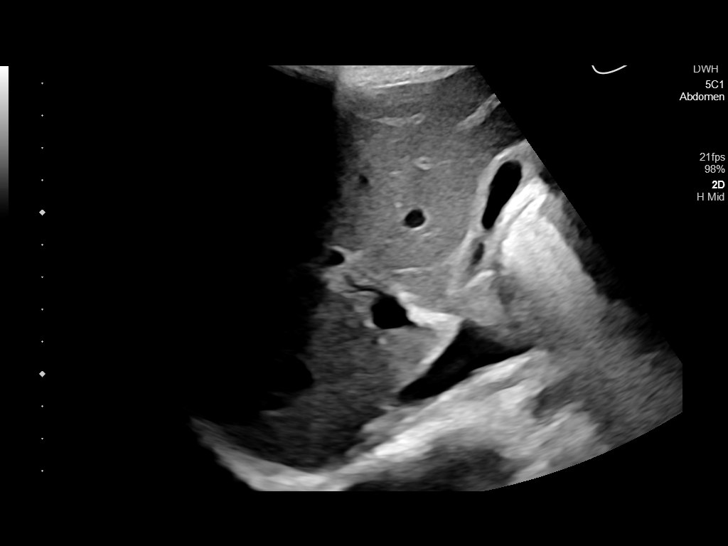
[im 32/35]
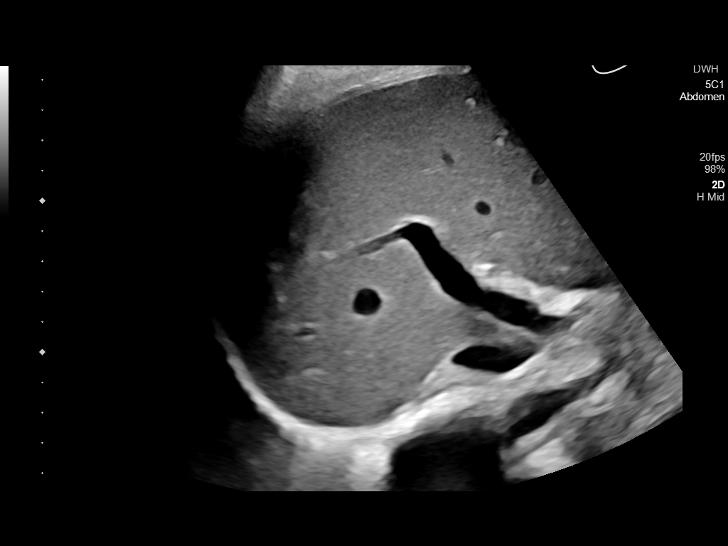
[im 35/35]
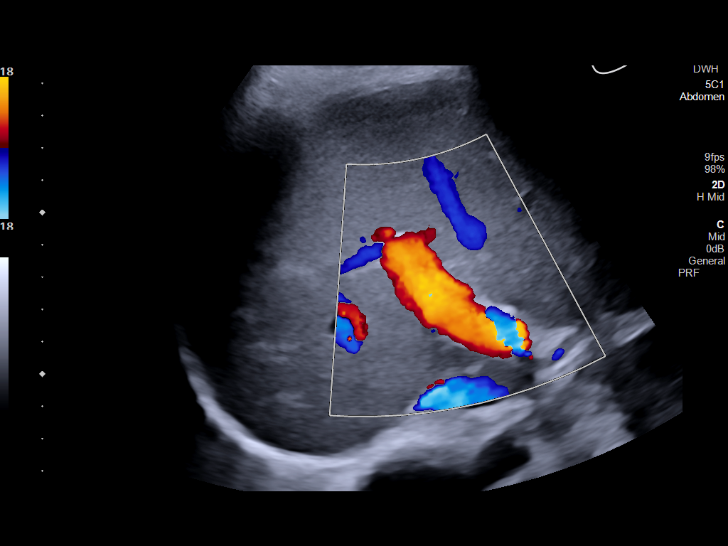

[14 of 25 positions shown; findings below may reference images not displayed]

FINDINGS: Gallbladder:

No gallstones or wall thickening visualized. No sonographic Murphy
sign noted by sonographer.

Common bile duct:

Diameter: 2 mm

Liver:

No focal lesion identified. Within normal limits in parenchymal
echogenicity. Portal vein is patent on color Doppler imaging with
normal direction of blood flow towards the liver.

Other: None.
IMPRESSION: Unremarkable right upper quadrant ultrasound.
